# Patient Record
Sex: Female | Born: 1995 | Race: Black or African American | Hispanic: No | Marital: Single | Smoking: Never smoker
Health system: Southern US, Community
[De-identification: ages and names within clinical notes are randomized; demographics above are authoritative.]

## PROBLEM LIST (undated history)

## (undated) DIAGNOSIS — J45909 Unspecified asthma, uncomplicated: Secondary | ICD-10-CM

---
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
## 2018-02-04 ENCOUNTER — Encounter (HOSPITAL_COMMUNITY): Payer: Self-pay

## 2018-02-04 ENCOUNTER — Other Ambulatory Visit: Payer: Self-pay

## 2018-02-04 ENCOUNTER — Ambulatory Visit (HOSPITAL_COMMUNITY)
Admission: EM | Admit: 2018-02-04 | Discharge: 2018-02-04 | Disposition: A | Discharge status: Home / Self Care | Payer: 59 | Attending: Family Medicine | Admitting: Family Medicine

## 2018-02-04 DIAGNOSIS — R079 Chest pain, unspecified: Secondary | ICD-10-CM

## 2018-02-04 NOTE — ED Provider Notes (Signed)
[facility]    CSN: [id number] Arrival date & time: 02/04/18  1452     History   Chief Complaint Chief Complaint  Patient presents with  . Chest Pain    HPI Lindsey Harrell is a 23 y.o. female.   Patient had some back pain and then chest pain last night.  There is no exertional component.  She does have some reflux symptoms from time to time.  She does have a history of asthma and uses an inhaler daily.  Risk factors include heart disease in her mom who is under age 10650 but she had no radiation to her arm diaphoresis nausea or vomiting  HPI  Past Medical History:  Diagnosis Date  . Asthma     There are no active problems to display for this patient.   History reviewed. No pertinent surgical history.  OB History   No obstetric history on file.      Home Medications    Prior to Admission medications   Medication Sig Start Date End Date Taking? Authorizing Provider  predniSONE (DELTASONE) 20 MG tablet Take 40 mg by mouth daily for 3 days, then 20mg  by mouth daily for 3 days, then 10mg  daily for 3 days 08/22/16   Garlon HatchetSanders, Lisa M, PA-C    Family History History reviewed. No pertinent family history.  Social History Social History   Tobacco Use  . Smoking status: Never Smoker  . Smokeless tobacco: Never Used  Substance Use Topics  . Alcohol use: Never    Frequency: Never  . Drug use: Never     Allergies   Patient has no known allergies.   Review of Systems Review of Systems  Cardiovascular: Positive for chest pain.  All other systems reviewed and are negative.    Physical Exam Triage Vital Signs ED Triage Vitals  Enc Vitals Group     BP 02/04/18 1545 126/70     Pulse Rate 02/04/18 1545 100     Resp 02/04/18 1545 16     Temp 02/04/18 1545 98.2 F (36.8 C)     Temp src --      SpO2 02/04/18 1545 100 %     Weight 02/04/18 1542 150 lb (68 kg)     Height --      Head Circumference --      Peak Flow --      Pain Score 02/04/18 1542 5       Pain Loc --      Pain Edu? --      Excl. in GC? --    No data found.  Updated Vital Signs BP 126/70 (BP Location: Right Arm)   Pulse 100   Temp 98.2 F (36.8 C)   Resp 16   Wt 68 kg   LMP 02/04/2018 Comment: implant  SpO2 100%   BMI 24.21 kg/m   Visual Acuity Right Eye Distance:   Left Eye Distance:   Bilateral Distance:    Right Eye Near:   Left Eye Near:    Bilateral Near:     Physical Exam Constitutional:      Appearance: She is well-developed and normal weight.  Neck:     Musculoskeletal: Normal range of motion and neck supple.  Cardiovascular:     Rate and Rhythm: Normal rate and regular rhythm.  No extrasystoles are present.    Heart sounds: Normal heart sounds. No friction rub. No S3 or S4 sounds.   Neurological:     General: No  focal deficit present.     Mental Status: She is alert and oriented to person, place, and time.      UC Treatments / Results  Labs (all labs ordered are listed, but only abnormal results are displayed) Labs Reviewed - No data to display  EKG None  Radiology No results found.  Procedures Procedures (including critical care time)  Medications Ordered in UC Medications - No data to display  Initial Impression / Assessment and Plan / UC Course  I have reviewed the triage vital signs and the nursing notes.  Pertinent labs & imaging results that were available during my care of the patient were reviewed by me and considered in my medical decision making (see chart for details).     Chest pain, noncardiac.  EKG shows no acute process but explained that EKG if negative is not predictive. Final Clinical Impressions(s) / UC Diagnoses   Final diagnoses:  None   Discharge Instructions   None    ED Prescriptions    None     Controlled Substance Prescriptions [place] Controlled Substance Registry consulted? No   Frederica Kuster, MD 02/04/18 [phone]

## 2018-02-04 NOTE — ED Triage Notes (Signed)
Pt  States she had a chest pain  That lasted about 30 minutes. Heavy pressure in her chest and upper back. This happened about an hour ago. Pt mom wants her heart checked out.

## 2018-04-07 IMAGING — CR DG CHEST 2V
2 series · 2 of 2 positions shown · non-contrast
Comparison: None.

CLINICAL DATA: Cold like symptoms x1 week.

EXAM:
CHEST  2 VIEW

[chest pa]
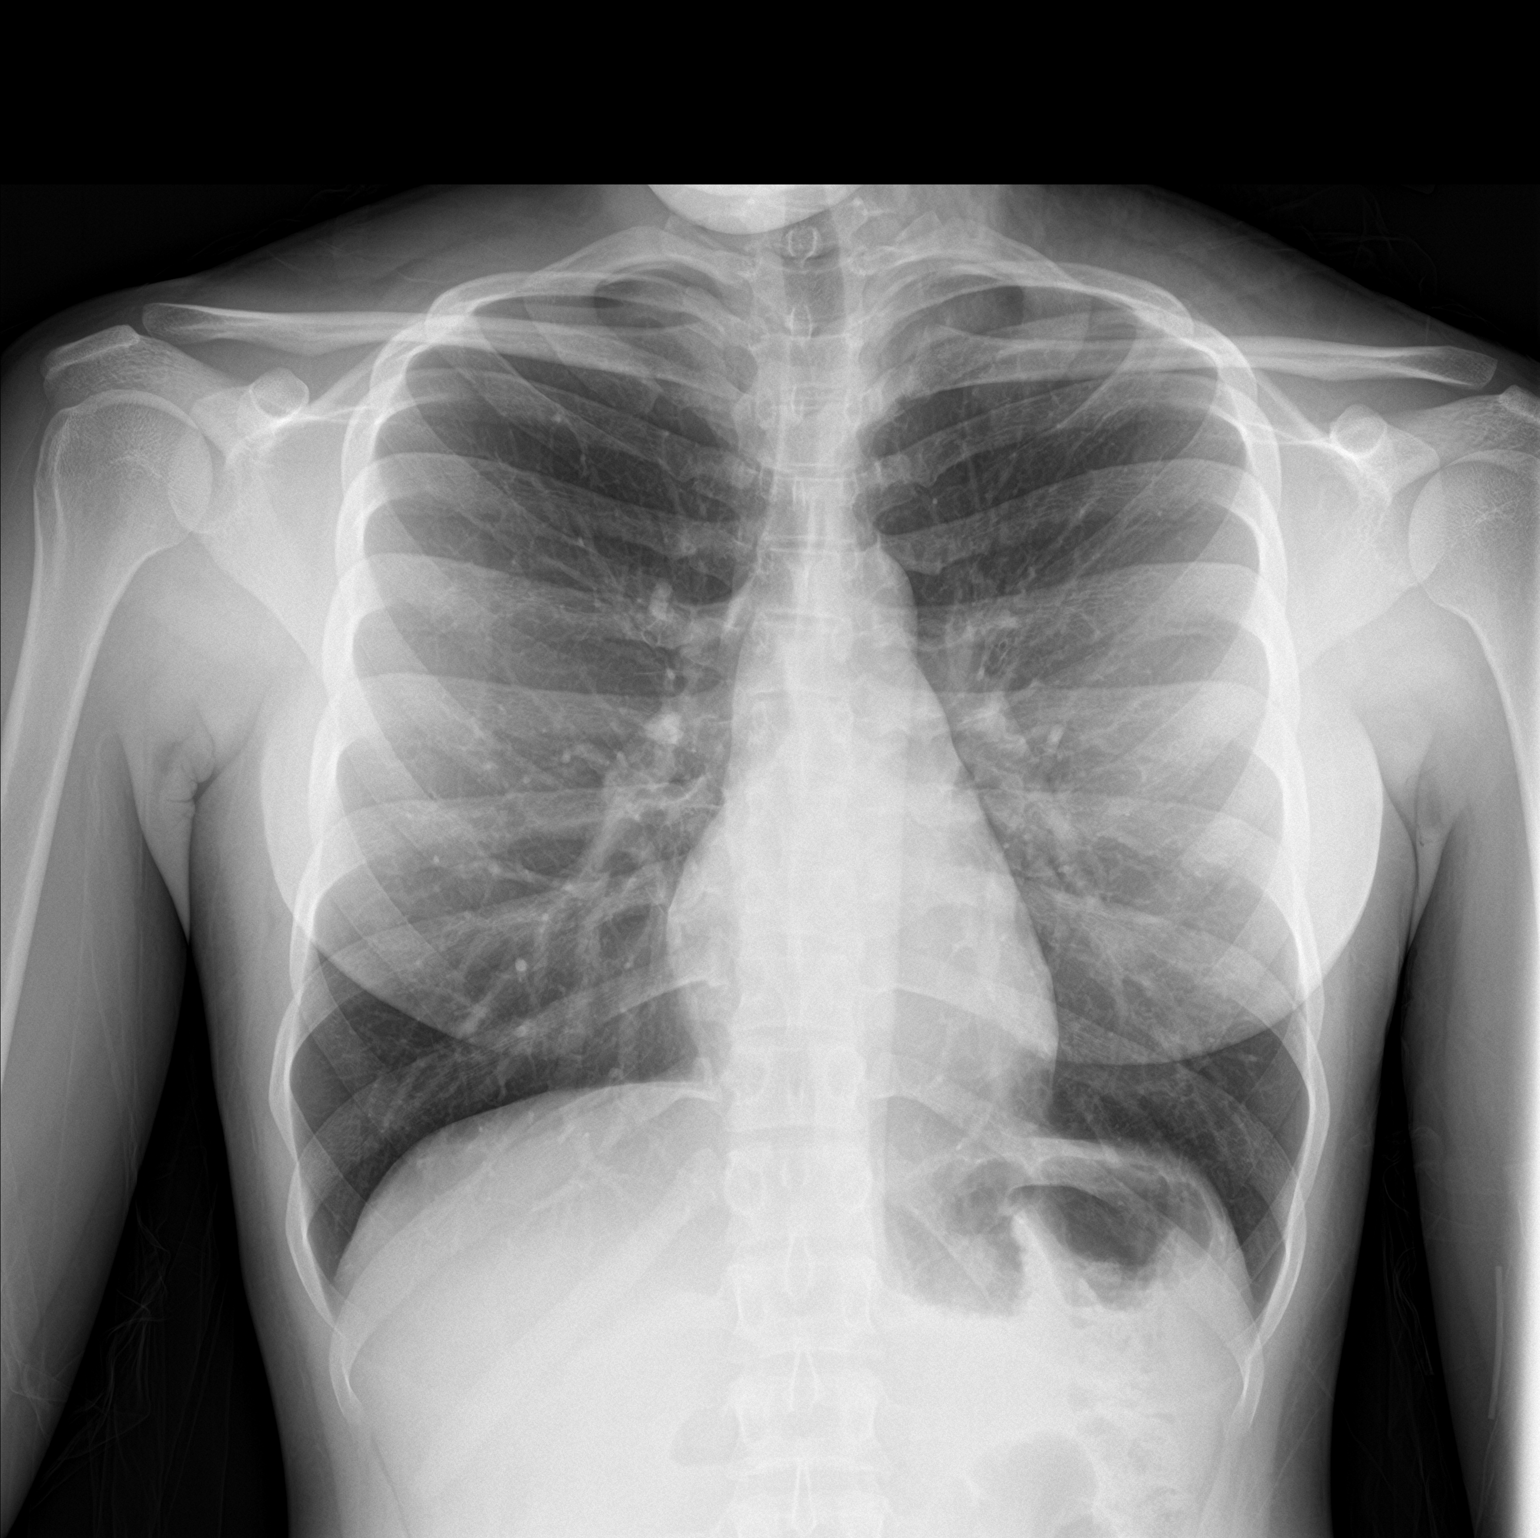

[chest lat]
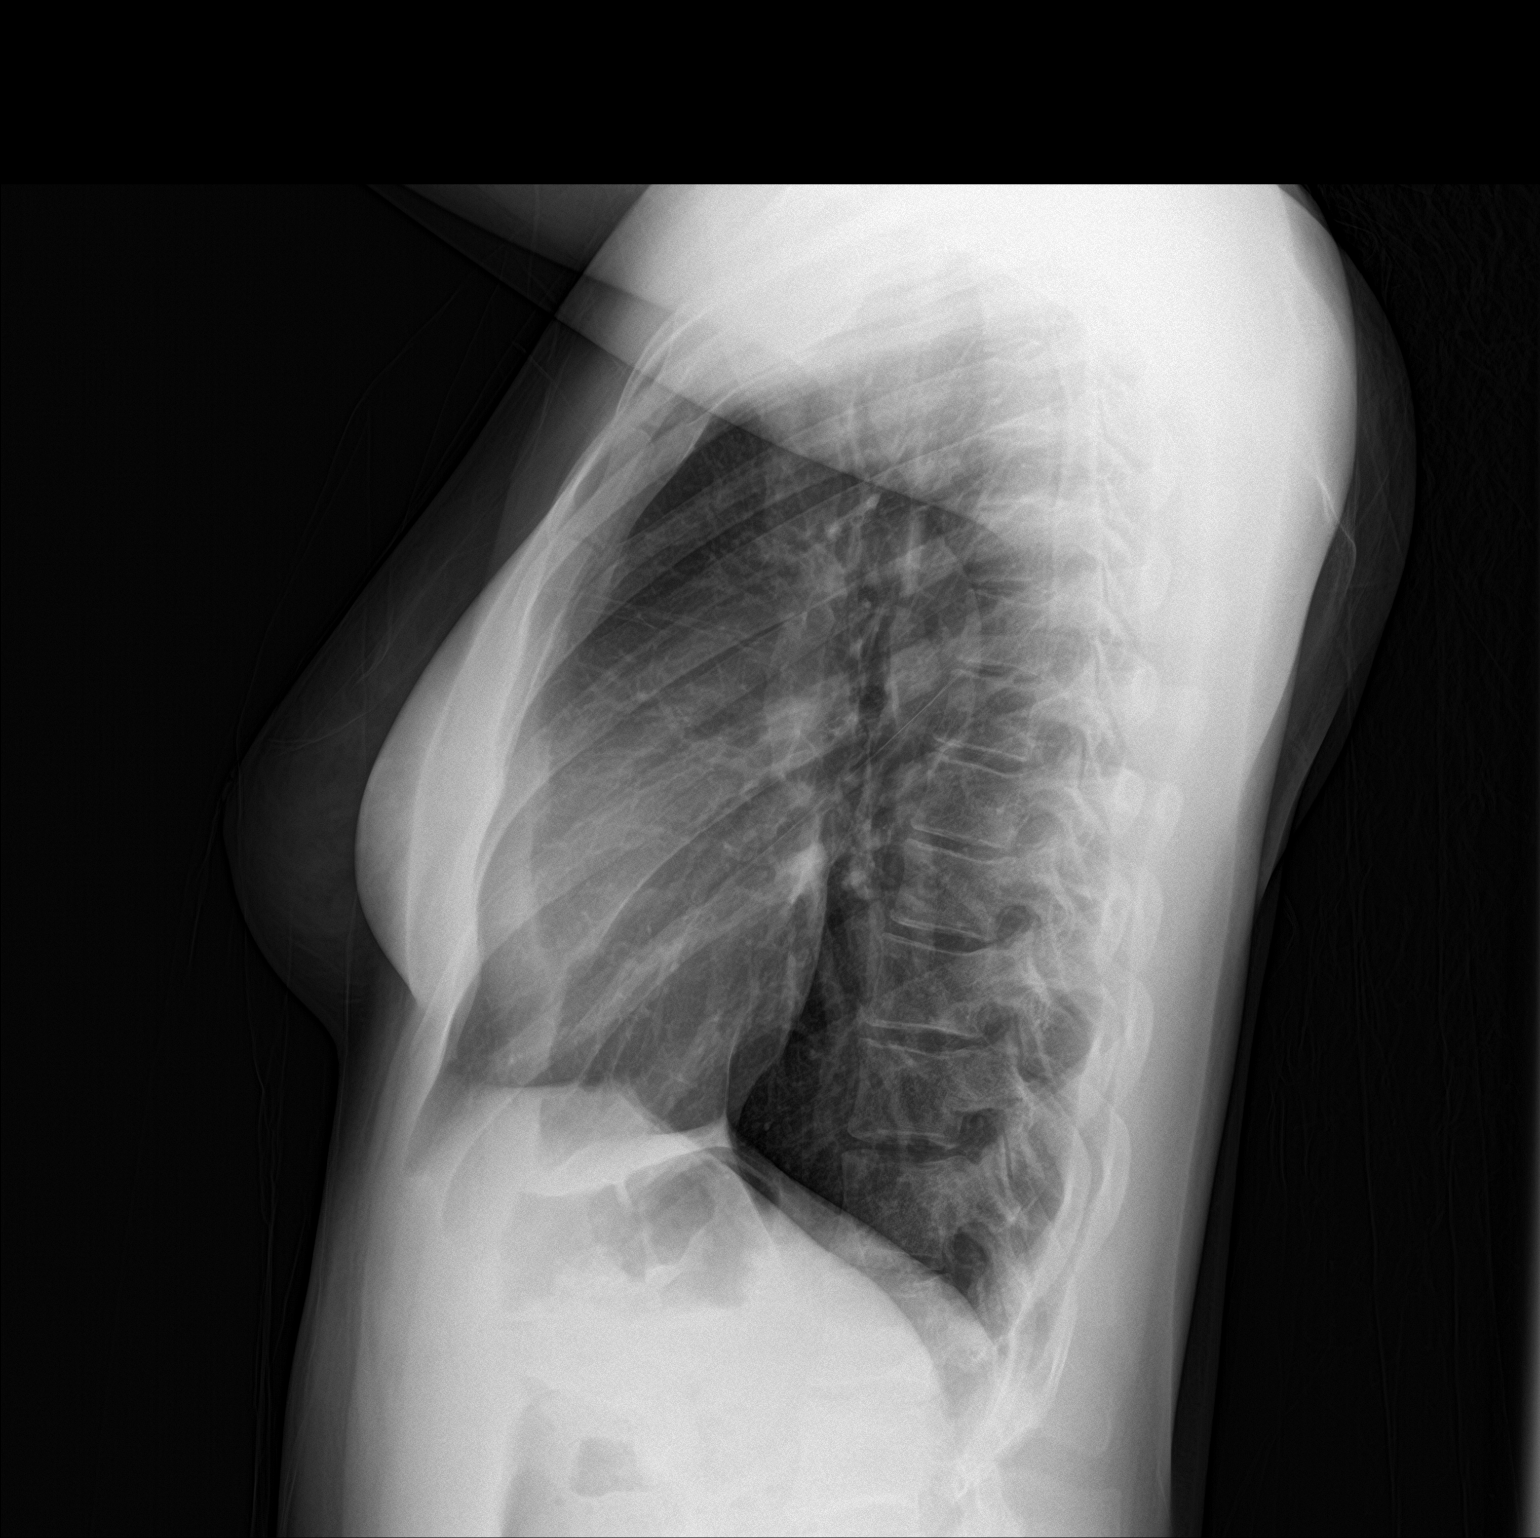

[2 of 2 positions shown; findings below may reference images not displayed]

FINDINGS: The heart size and mediastinal contours are within normal limits.
Both lungs are clear. The visualized skeletal structures are
unremarkable.
IMPRESSION: No active cardiopulmonary disease.
